# Patient Record
Sex: Male | Born: 1990 | Race: White | Hispanic: No | Marital: Single | Smoking: Never smoker
Health system: Southern US, Community
[De-identification: ages and names within clinical notes are randomized; demographics above are authoritative.]

## PROBLEM LIST (undated history)

## (undated) DIAGNOSIS — N63 Unspecified lump in unspecified breast: Secondary | ICD-10-CM

---
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
## 2014-10-30 HISTORY — PX: ANKLE ARTHROSCOPY: SHX545

## 2015-05-12 ENCOUNTER — Other Ambulatory Visit: Payer: Self-pay | Admitting: Surgery

## 2015-05-13 ENCOUNTER — Other Ambulatory Visit: Payer: Self-pay | Admitting: Surgery

## 2015-05-13 DIAGNOSIS — N63 Unspecified lump in unspecified breast: Secondary | ICD-10-CM

## 2015-05-17 ENCOUNTER — Ambulatory Visit
Admission: RE | Admit: 2015-05-17 | Discharge: 2015-05-17 | Disposition: A | Discharge status: Home / Self Care | Payer: 59 | Source: Ambulatory Visit | Attending: Surgery | Admitting: Surgery

## 2015-05-17 DIAGNOSIS — N63 Unspecified lump in unspecified breast: Secondary | ICD-10-CM

## 2015-05-27 ENCOUNTER — Other Ambulatory Visit: Payer: Self-pay | Admitting: Surgery

## 2015-06-30 DIAGNOSIS — N63 Unspecified lump in unspecified breast: Secondary | ICD-10-CM

## 2015-06-30 HISTORY — DX: Unspecified lump in unspecified breast: N63.0

## 2015-07-15 ENCOUNTER — Encounter (HOSPITAL_BASED_OUTPATIENT_CLINIC_OR_DEPARTMENT_OTHER): Payer: Self-pay | Admitting: *Deleted

## 2015-07-21 NOTE — H&P (Signed)
Brian Mcmahon  [facility] Patient #: [id number] DOB: 04/19/1990 Single / Language: [place] / Race: White Male   History of Present Illness The patient is a 25 year old male who presents with a breast mass. This gentleman is referred by Charlies SilversJennifer Couillard PA for evaluation of a left breast mass. The patient reports that he felt a small mass approximately year ago but it is slowly getting larger and causing him discomfort at his nipple and areola. He denies any drainage. He denies any trauma to the area. There is no family history of breast cancer. He is otherwise healthy without complaints.   Other Problems  No pertinent past medical history  Past Surgical History Milas Hock(Bernie Morris, CMA;  Foot Surgery Left.  Diagnostic Studies History Milas Hock(Bernie Morris, CMA;  Colonoscopy never  Allergies Milas Hock(Bernie Morris, CMA;  No Known Drug Allergies04/13/2017  Medication History Milas Hock(Bernie Morris, CMA;  No Current Medications Medications Reconciled  Social History Milas Hock(Bernie Morris, CMA; 4 Alcohol use Occasional alcohol use. No caffeine use No drug use  Family History Cyndra Numbers(Bernie Morris, CMA;  Hypertension Father.    Review of Systems Cyndra Numbers(Bernie Morris CMA; General Not Present- Appetite Loss, Chills, Fatigue, Fever, Night Sweats, Weight Gain and Weight Loss. Skin Not Present- Change in Wart/Mole, Dryness, Hives, Jaundice, New Lesions, Non-Healing Wounds, Rash and Ulcer. HEENT Not Present- Earache, Hearing Loss, Hoarseness, Nose Bleed, Oral Ulcers, Ringing in the Ears, Seasonal Allergies, Sinus Pain, Sore Throat, Visual Disturbances, Wears glasses/contact lenses and Yellow Eyes. Respiratory Not Present- Bloody sputum, Chronic Cough, Difficulty Breathing, Snoring and Wheezing. Breast Present- Breast Mass. Not Present- Breast Pain, Nipple Discharge and Skin Changes. Cardiovascular Not Present- Chest Pain, Difficulty Breathing Lying Down, Leg Cramps, Palpitations, Rapid Heart  Rate, Shortness of Breath and Swelling of Extremities. Gastrointestinal Not Present- Abdominal Pain, Bloating, Bloody Stool, Change in Bowel Habits, Chronic diarrhea, Constipation, Difficulty Swallowing, Excessive gas, Gets full quickly at meals, Hemorrhoids, Indigestion, Nausea, Rectal Pain and Vomiting. Male Genitourinary Not Present- Blood in Urine, Change in Urinary Stream, Frequency, Impotence, Nocturia, Painful Urination, Urgency and Urine Leakage. Musculoskeletal Not Present- Back Pain, Joint Pain, Joint Stiffness, Muscle Pain, Muscle Weakness and Swelling of Extremities. Neurological Not Present- Decreased Memory, Fainting, Headaches, Numbness, Seizures, Tingling, Tremor, Trouble walking and Weakness. Psychiatric Not Present- Anxiety, Bipolar, Change in Sleep Pattern, Depression, Fearful and Frequent crying. Endocrine Not Present- Cold Intolerance, Excessive Hunger, Hair Changes, Heat Intolerance, Hot flashes and New Diabetes. Hematology Not Present- Easy Bruising, Excessive bleeding, Gland problems, HIV and Persistent Infections.  Vitals  Weight: 175.2 lb Height: 75in Body Surface Area: 2.07 m Body Mass Index: 21.9 kg/m  Temp.: 98.29F(Oral)  Pulse: 62 (Regular)  BP: 120/80 (Sitting, Left Arm, Standard)    Physical Exam General Mental Status-Alert. General Appearance-Consistent with stated age. Hydration-Well hydrated. Voice-Normal.  Head and Neck Head-normocephalic, atraumatic with no lesions or palpable masses. Trachea-midline.  Eye Eyeball - Bilateral-Extraocular movements intact. Sclera/Conjunctiva - Bilateral-No scleral icterus.  Chest and Lung Exam Chest and lung exam reveals -quiet, even and easy respiratory effort with no use of accessory muscles and on auscultation, normal breath sounds, no adventitious sounds and normal vocal resonance. Inspection Chest Wall - Normal. Back - normal.  Breast Note: There is approximate 3 cm mass  underneath and lateral to the left nipple/areolar complex. It is mobile and nontender today. There is no axillary adenopathy   Cardiovascular Cardiovascular examination reveals -normal heart sounds, regular rate and rhythm with no murmurs and normal pedal pulses bilaterally.  Abdomen Inspection Skin -  Scar - no surgical scars. Hernias - Ventral - Reducible. Inguinal hernia - Left - Reducible. Palpation/Percussion Palpation and Percussion of the abdomen reveal - Soft, Non Tender, No Rebound tenderness, No Rigidity (guarding) and No hepatosplenomegaly. Auscultation Auscultation of the abdomen reveals - Bowel sounds normal.  Neurologic - Did not examine.  Musculoskeletal Normal Exam - Left-Upper Extremity Strength Normal and Lower Extremity Strength Normal. Normal Exam - Right-Upper Extremity Strength Normal, Lower Extremity Weakness.  Lymphatic Femoral & Inguinal - Did not examine. Note: There is no left axillary or supraclavicular adenopathy    Assessment & Plan  LEFT BREAST MASS   Impression: I discussed the diagnosis with the patient and his mom. I'm uncertain of the etiology of this mass. An ultrasound is recommended for further evaluation. Surgical excision will probably also be necessary. I will call back with the results of the ultrasound.  Addendum:  Ultrasound was equivocal.  Will plan on excision of the left breast mass in the OR.  I discussed the risks which include but is no limited to bleeding, infection, seroma formation, recurrence, need for further surgery, nipple necrosis, etc.  He understands and wishes to proceed.

## 2015-07-22 ENCOUNTER — Ambulatory Visit (HOSPITAL_BASED_OUTPATIENT_CLINIC_OR_DEPARTMENT_OTHER): Payer: 59 | Admitting: Anesthesiology

## 2015-07-22 ENCOUNTER — Ambulatory Visit (HOSPITAL_BASED_OUTPATIENT_CLINIC_OR_DEPARTMENT_OTHER)
Admission: RE | Admit: 2015-07-22 | Discharge: 2015-07-22 | Disposition: A | Discharge status: Home / Self Care | Payer: 59 | Source: Ambulatory Visit | Attending: Surgery | Admitting: Surgery

## 2015-07-22 ENCOUNTER — Encounter (HOSPITAL_BASED_OUTPATIENT_CLINIC_OR_DEPARTMENT_OTHER): Payer: Self-pay | Admitting: Anesthesiology

## 2015-07-22 ENCOUNTER — Encounter (HOSPITAL_BASED_OUTPATIENT_CLINIC_OR_DEPARTMENT_OTHER)
Admission: RE | Disposition: A | Discharge status: Home / Self Care | Payer: Self-pay | Source: Ambulatory Visit | Attending: Surgery

## 2015-07-22 DIAGNOSIS — N62 Hypertrophy of breast: Secondary | ICD-10-CM | POA: Diagnosis not present

## 2015-07-22 DIAGNOSIS — N63 Unspecified lump in breast: Secondary | ICD-10-CM | POA: Diagnosis present

## 2015-07-22 HISTORY — DX: Unspecified lump in unspecified breast: N63.0

## 2015-07-22 HISTORY — PX: EXCISION OF BREAST BIOPSY: SHX5822

## 2015-07-22 SURGERY — EXCISION OF BREAST BIOPSY
Anesthesia: General | Site: Breast | Laterality: Left

## 2015-07-22 MED ORDER — LACTATED RINGERS IV SOLN
INTRAVENOUS | Status: DC | PRN
  Administered 2015-07-22 (×2): via INTRAVENOUS

## 2015-07-22 MED ORDER — BUPIVACAINE-EPINEPHRINE (PF) 0.5% -1:200000 IJ SOLN
INTRAMUSCULAR | Status: AC
  Filled 2015-07-22: qty 30

## 2015-07-22 MED ORDER — PROPOFOL 10 MG/ML IV BOLUS
INTRAVENOUS | Status: DC | PRN
  Administered 2015-07-22: 200 mg via INTRAVENOUS

## 2015-07-22 MED ORDER — ACETAMINOPHEN 325 MG PO TABS: 650.0000 mg | ORAL_TABLET | ORAL | Status: DC | PRN

## 2015-07-22 MED ORDER — SODIUM CHLORIDE 0.9% FLUSH
3.0000 mL | Freq: Two times a day (BID) | INTRAVENOUS | Status: DC
Start: 2015-07-22 — End: 2015-07-22

## 2015-07-22 MED ORDER — MIDAZOLAM HCL 2 MG/2ML IJ SOLN: 0.5000 mg | Freq: Once | INTRAMUSCULAR | Status: DC | PRN

## 2015-07-22 MED ORDER — LIDOCAINE HCL (CARDIAC) 20 MG/ML IV SOLN
INTRAVENOUS | Status: DC | PRN
  Administered 2015-07-22: 30 mg via INTRAVENOUS

## 2015-07-22 MED ORDER — LIDOCAINE 2% (20 MG/ML) 5 ML SYRINGE
INTRAMUSCULAR | Status: AC
  Filled 2015-07-22: qty 5

## 2015-07-22 MED ORDER — BUPIVACAINE HCL (PF) 0.5 % IJ SOLN
INTRAMUSCULAR | Status: DC | PRN
  Administered 2015-07-22: 14 mL

## 2015-07-22 MED ORDER — MORPHINE SULFATE (PF) 2 MG/ML IV SOLN: 1.0000 mg | INTRAVENOUS | Status: DC | PRN

## 2015-07-22 MED ORDER — ONDANSETRON HCL 4 MG/2ML IJ SOLN
INTRAMUSCULAR | Status: DC | PRN
  Administered 2015-07-22: 4 mg via INTRAVENOUS

## 2015-07-22 MED ORDER — ACETAMINOPHEN 650 MG RE SUPP: 650.0000 mg | RECTAL | Status: DC | PRN

## 2015-07-22 MED ORDER — OXYCODONE-ACETAMINOPHEN 5-325 MG PO TABS: 1.0000 | ORAL_TABLET | ORAL | Status: DC | PRN

## 2015-07-22 MED ORDER — LACTATED RINGERS IV SOLN
INTRAVENOUS | Status: DC
  Administered 2015-07-22: 07:00:00 via INTRAVENOUS

## 2015-07-22 MED ORDER — GLYCOPYRROLATE 0.2 MG/ML IJ SOLN: 0.2000 mg | Freq: Once | INTRAMUSCULAR | Status: DC | PRN

## 2015-07-22 MED ORDER — SCOPOLAMINE 1 MG/3DAYS TD PT72: 1.0000 | MEDICATED_PATCH | Freq: Once | TRANSDERMAL | Status: DC | PRN

## 2015-07-22 MED ORDER — MIDAZOLAM HCL 2 MG/2ML IJ SOLN
INTRAMUSCULAR | Status: AC
  Filled 2015-07-22: qty 2

## 2015-07-22 MED ORDER — FENTANYL CITRATE (PF) 100 MCG/2ML IJ SOLN: 25.0000 ug | INTRAMUSCULAR | Status: DC | PRN

## 2015-07-22 MED ORDER — DEXAMETHASONE SODIUM PHOSPHATE 4 MG/ML IJ SOLN
INTRAMUSCULAR | Status: DC | PRN
  Administered 2015-07-22: 10 mg via INTRAVENOUS

## 2015-07-22 MED ORDER — FENTANYL CITRATE (PF) 100 MCG/2ML IJ SOLN: 50.0000 ug | INTRAMUSCULAR | Status: DC | PRN

## 2015-07-22 MED ORDER — PROMETHAZINE HCL 25 MG/ML IJ SOLN: 6.2500 mg | INTRAMUSCULAR | Status: DC | PRN

## 2015-07-22 MED ORDER — CEFAZOLIN SODIUM-DEXTROSE 2-4 GM/100ML-% IV SOLN
INTRAVENOUS | Status: AC
  Filled 2015-07-22: qty 100

## 2015-07-22 MED ORDER — KETOROLAC TROMETHAMINE 30 MG/ML IJ SOLN
INTRAMUSCULAR | Status: AC
  Filled 2015-07-22: qty 1

## 2015-07-22 MED ORDER — DEXAMETHASONE SODIUM PHOSPHATE 10 MG/ML IJ SOLN
INTRAMUSCULAR | Status: AC
  Filled 2015-07-22: qty 1

## 2015-07-22 MED ORDER — SODIUM CHLORIDE 0.9 % IV SOLN: 250.0000 mL | INTRAVENOUS | Status: DC | PRN

## 2015-07-22 MED ORDER — MIDAZOLAM HCL 5 MG/5ML IJ SOLN
INTRAMUSCULAR | Status: DC | PRN
  Administered 2015-07-22: 2 mg via INTRAVENOUS

## 2015-07-22 MED ORDER — MEPERIDINE HCL 25 MG/ML IJ SOLN: 6.2500 mg | INTRAMUSCULAR | Status: DC | PRN

## 2015-07-22 MED ORDER — PROPOFOL 10 MG/ML IV BOLUS
INTRAVENOUS | Status: AC
  Filled 2015-07-22: qty 40

## 2015-07-22 MED ORDER — ONDANSETRON HCL 4 MG/2ML IJ SOLN
INTRAMUSCULAR | Status: AC
  Filled 2015-07-22: qty 2

## 2015-07-22 MED ORDER — MIDAZOLAM HCL 2 MG/2ML IJ SOLN: 1.0000 mg | INTRAMUSCULAR | Status: DC | PRN

## 2015-07-22 MED ORDER — FENTANYL CITRATE (PF) 100 MCG/2ML IJ SOLN
INTRAMUSCULAR | Status: DC | PRN
  Administered 2015-07-22: 100 ug via INTRAVENOUS

## 2015-07-22 MED ORDER — KETOROLAC TROMETHAMINE 30 MG/ML IJ SOLN
INTRAMUSCULAR | Status: DC | PRN
  Administered 2015-07-22: 30 mg via INTRAVENOUS

## 2015-07-22 MED ORDER — BUPIVACAINE HCL (PF) 0.5 % IJ SOLN
INTRAMUSCULAR | Status: AC
  Filled 2015-07-22: qty 30

## 2015-07-22 MED ORDER — SODIUM CHLORIDE 0.9% FLUSH: 3.0000 mL | INTRAVENOUS | Status: DC | PRN

## 2015-07-22 MED ORDER — FENTANYL CITRATE (PF) 100 MCG/2ML IJ SOLN
INTRAMUSCULAR | Status: AC
  Filled 2015-07-22: qty 2

## 2015-07-22 MED ORDER — CEFAZOLIN SODIUM-DEXTROSE 2-4 GM/100ML-% IV SOLN
2.0000 g | INTRAVENOUS | Status: AC
  Administered 2015-07-22: 2 g via INTRAVENOUS

## 2015-07-22 MED ORDER — OXYCODONE HCL 5 MG PO TABS: 5.0000 mg | ORAL_TABLET | ORAL | Status: DC | PRN

## 2015-07-22 SURGICAL SUPPLY — 42 items
BLADE CLIPPER SURG (BLADE) ×2 IMPLANT
BLADE HEX COATED 2.75 (ELECTRODE) ×2 IMPLANT
BLADE SURG 15 STRL LF DISP TIS (BLADE) ×1 IMPLANT
BLADE SURG 15 STRL SS (BLADE) ×1
CANISTER SUCT 1200ML W/VALVE (MISCELLANEOUS) IMPLANT
CHLORAPREP W/TINT 26ML (MISCELLANEOUS) ×2 IMPLANT
CLIP TI WIDE RED SMALL 6 (CLIP) IMPLANT
COVER BACK TABLE 60X90IN (DRAPES) ×2 IMPLANT
COVER MAYO STAND STRL (DRAPES) ×2 IMPLANT
DECANTER SPIKE VIAL GLASS SM (MISCELLANEOUS) IMPLANT
DEVICE DUBIN W/COMP PLATE 8390 (MISCELLANEOUS) IMPLANT
DRAPE LAPAROTOMY 100X72 PEDS (DRAPES) ×2 IMPLANT
DRAPE UTILITY XL STRL (DRAPES) ×2 IMPLANT
ELECT REM PT RETURN 9FT ADLT (ELECTROSURGICAL) ×2
ELECTRODE REM PT RTRN 9FT ADLT (ELECTROSURGICAL) ×1 IMPLANT
GLOVE BIOGEL PI IND STRL 7.0 (GLOVE) ×1 IMPLANT
GLOVE BIOGEL PI INDICATOR 7.0 (GLOVE) ×1
GLOVE ECLIPSE 6.5 STRL STRAW (GLOVE) ×2 IMPLANT
GLOVE EXAM NITRILE EXT CUFF MD (GLOVE) ×2 IMPLANT
GLOVE SURG SIGNA 7.5 PF LTX (GLOVE) ×2 IMPLANT
GOWN STRL REUS W/ TWL LRG LVL3 (GOWN DISPOSABLE) ×1 IMPLANT
GOWN STRL REUS W/ TWL XL LVL3 (GOWN DISPOSABLE) ×1 IMPLANT
GOWN STRL REUS W/TWL LRG LVL3 (GOWN DISPOSABLE) ×1
GOWN STRL REUS W/TWL XL LVL3 (GOWN DISPOSABLE) ×1
KIT MARKER MARGIN INK (KITS) IMPLANT
LIQUID BAND (GAUZE/BANDAGES/DRESSINGS) ×2 IMPLANT
NEEDLE HYPO 25X1 1.5 SAFETY (NEEDLE) ×2 IMPLANT
NS IRRIG 1000ML POUR BTL (IV SOLUTION) IMPLANT
PACK BASIN DAY SURGERY FS (CUSTOM PROCEDURE TRAY) ×2 IMPLANT
PENCIL BUTTON HOLSTER BLD 10FT (ELECTRODE) ×2 IMPLANT
SLEEVE SCD COMPRESS KNEE MED (MISCELLANEOUS) ×2 IMPLANT
SPONGE GAUZE 4X4 12PLY STER LF (GAUZE/BANDAGES/DRESSINGS) IMPLANT
SPONGE LAP 4X18 X RAY DECT (DISPOSABLE) ×2 IMPLANT
SUT MNCRL AB 4-0 PS2 18 (SUTURE) ×2 IMPLANT
SUT SILK 2 0 SH (SUTURE) IMPLANT
SUT VIC AB 3-0 SH 27 (SUTURE) ×1
SUT VIC AB 3-0 SH 27X BRD (SUTURE) ×1 IMPLANT
SYR CONTROL 10ML LL (SYRINGE) ×2 IMPLANT
TOWEL OR 17X24 6PK STRL BLUE (TOWEL DISPOSABLE) ×2 IMPLANT
TOWEL OR NON WOVEN STRL DISP B (DISPOSABLE) IMPLANT
TUBE CONNECTING 20X1/4 (TUBING) IMPLANT
YANKAUER SUCT BULB TIP NO VENT (SUCTIONS) IMPLANT

## 2015-07-22 NOTE — Transfer of Care (Signed)
Immediate Anesthesia Transfer of Care Note  Patient: Brian Mcmahon  Procedure(s) Performed: Procedure(s): EXCISION LEFT BREAST  MASS (Left)  Patient Location: PACU  Anesthesia Type:General  Level of Consciousness: sedated  Airway & Oxygen Therapy: Patient Spontanous Breathing and Patient connected to face mask oxygen  Post-op Assessment: Report given to RN and Post -op Vital signs reviewed and stable  Post vital signs: Reviewed and stable  Last Vitals:  Filed Vitals:   07/22/15 0710  BP: 124/63  Pulse: 57  Temp: 36.7 C  Resp: 20    Last Pain: There were no vitals filed for this visit.       Complications: No apparent anesthesia complications

## 2015-07-22 NOTE — Interval H&P Note (Signed)
History and Physical Interval Note: no change in H and P  07/22/2015 6:58 AM  Brian Mcmahon  has presented today for surgery, with the diagnosis of Left breast mass   The various methods of treatment have been discussed with the patient and family. After consideration of risks, benefits and other options for treatment, the patient has consented to  Procedure(s): EXCISION LEFT BREAST  MASS (Left) as a surgical intervention .  The patient's history has been reviewed, patient examined, no change in status, stable for surgery.  I have reviewed the patient's chart and labs.  Questions were answered to the patient's satisfaction.     [name] A

## 2015-07-22 NOTE — Op Note (Signed)
EXCISION LEFT BREAST  MASS  Procedure Note  Marinell BlightChase B Duque 07/22/2015   Pre-op Diagnosis: Left breast mass      Post-op Diagnosis: same  Procedure(s): LEFT BREAST LUMPECTOMY  Surgeon(s): Abigail Miyamotoouglas Aniza Shor, MD  Anesthesia: General  Staff:  Circulator: Lenn CalPatricia J Campbell, RN Scrub Person: Joie BimlerFlor M Neiers, CST  Estimated Blood Loss: Minimal               Specimens: sent to path          [facility] Center[facility]ACK[facility],Emmanuella Mirante A   Date: 07/22/2015  Time: 8:15 AM

## 2015-07-22 NOTE — Op Note (Signed)
NAMLoraine Mcmahon:  Brian Mcmahon, Brian Mcmahon                  ACCOUNT NO.:  [id number]  MEDICAL RECORD NO.:  [id number]  LOCATION:                                 FACILITY:  PHYSICIAN:  Abigail Miyamotoouglas Carlena Ruybal, M.D. DATE OF BIRTH:  12-10-1990  DATE OF PROCEDURE:  07/22/2015 DATE OF DISCHARGE:                              OPERATIVE REPORT   PREOPERATIVE DIAGNOSIS:  Left breast mass.  POSTOPERATIVE DIAGNOSIS:  Left breast mass.  PROCEDURE:  Left breast lumpectomy.  SURGEON:  Abigail Miyamotoouglas Kharee Lesesne, M.D.  ANESTHESIA:  General and 0.5% Marcaine.  ESTIMATED BLOOD LOSS:  Minimal.  INDICATIONS:  This is a 25 year old gentleman who presents with a left breast mass.  Ultrasound confirmed a 4 cm mass.  There is a question whether this could represent gynecomastia.  Decision was made to proceed with excision for histologic evaluation to rule out malignancy.  PROCEDURE IN DETAIL:  The patient was brought to the operating room, identified as __________ [name].  He was placed supine on the operating room table and general anesthesia was induced.  His left breast was then prepped and draped in the usual sterile fashion.  I anesthetized the skin around the outer edge of the areola with Marcaine.  I then made a circumareolar incision with a scalpel.  I then took this down to the breast tissue with electrocautery.  I then widely excised the left breast mass with electrocautery going down to the chest wall.  Once the entire mass was removed, I had to remove another section of tissue with the cautery in the inferior portion __________ mass.  This was then sent to Pathology for evaluation.  Hemostasis was achieved with cautery.  I anesthetized the wound further with Marcaine.  I then closed subcutaneous tissue with interrupted 3-0 Vicryl sutures and closed skin with a running 4-0 Monocryl.  Skin glue was then applied.  The patient tolerated the procedure well.  All sponge, needle, and instrument counts were correct at the end of the  procedure.  The patient was then extubated in the operating room and taken in a stable condition to the recovery room.     Abigail Miyamotoouglas Ilyssa Grennan, M.D.   ______________________________ Abigail Miyamotoouglas Zaivion Kundrat, M.D.    DB/MEDQ  D:  07/22/2015  T:  07/22/2015  Job:  [id number]

## 2015-07-22 NOTE — Discharge Instructions (Signed)
[facility] °Office Phone Number [phone] ° °BREAST BIOPSY/ PARTIAL MASTECTOMY: POST OP INSTRUCTIONS ° °Always review your discharge instruction sheet given to you by the facility where your surgery was performed. ° °IF YOU HAVE DISABILITY OR FAMILY LEAVE FORMS, YOU MUST BRING THEM TO THE OFFICE FOR PROCESSING.  DO NOT GIVE THEM TO YOUR DOCTOR. ° °1. A prescription for pain medication may be given to you upon discharge.  Take your pain medication as prescribed, if needed.  If narcotic pain medicine is not needed, then you may take acetaminophen (Tylenol) or ibuprofen (Advil) as needed. °2. Take your usually prescribed medications unless otherwise directed °3. If you need a refill on your pain medication, please contact your pharmacy.  They will contact our office to request authorization.  Prescriptions will not be filled after 5pm or on week-ends. °4. You should eat very light the first 24 hours after surgery, such as soup, crackers, pudding, etc.  Resume your normal diet the day after surgery. °5. Most patients will experience some swelling and bruising in the breast.  Ice packs and a good support bra will help.  Swelling and bruising can take several days to resolve.  °6. It is common to experience some constipation if taking pain medication after surgery.  Increasing fluid intake and taking a stool softener will usually help or prevent this problem from occurring.  A mild laxative (Milk of Magnesia or Miralax) should be taken according to package directions if there are no bowel movements after 48 hours. °7. Unless discharge instructions indicate otherwise, you may remove your bandages 24-48 hours after surgery, and you may shower at that time.  You may have steri-strips (small skin tapes) in place directly over the incision.  These strips should be left on the skin for 7-10 days.  If your surgeon used skin glue on the incision, you may shower in 24 hours.  The glue will flake off over the  next 2-3 weeks.  Any sutures or staples will be removed at the office during your follow-up visit. °8. ACTIVITIES:  You may resume regular daily activities (gradually increasing) beginning the next day.  Wearing a good support bra or sports bra minimizes pain and swelling.  You may have sexual intercourse when it is comfortable. °a. You may drive when you no longer are taking prescription pain medication, you can comfortably wear a seatbelt, and you can safely maneuver your car and apply brakes. °b. RETURN TO WORK:  ______________________________________________________________________________________ °9. You should see your doctor in the office for a follow-up appointment approximately two weeks after your surgery.  Your doctor’s nurse will typically make your follow-up appointment when she calls you with your pathology report.  Expect your pathology report 2-3 business days after your surgery.  You may call to check if you do not hear from us after three days. °10. OTHER INSTRUCTIONS: _______________________________________________________________________________________________ _____________________________________________________________________________________________________________________________________ °_____________________________________________________________________________________________________________________________________ °_____________________________________________________________________________________________________________________________________ ° °WHEN TO CALL YOUR DOCTOR: °1. Fever over 101.0 °2. Nausea and/or vomiting. °3. Extreme swelling or bruising. °4. Continued bleeding from incision. °5. Increased pain, redness, or drainage from the incision. ° °The clinic staff is available to answer your questions during regular business hours.  Please don’t hesitate to call and ask to speak to one of the nurses for clinical concerns.  If you have a medical emergency, go to the nearest  emergency room or call 911.  A surgeon from [facility] is always on call at the hospital. ° °For further questions, please visit [email or web address]  ° ° ° °  Post Anesthesia Home Care Instructions ° °Activity: °Get plenty of rest for the remainder of the day. A responsible adult should stay with you for 24 hours following the procedure.  °For the next 24 hours, DO NOT: °-Drive a car °-Operate machinery °-Drink alcoholic beverages °-Take any medication unless instructed by your physician °-Make any legal decisions or sign important papers. ° °Meals: °Start with liquid foods such as gelatin or soup. Progress to regular foods as tolerated. Avoid greasy, spicy, heavy foods. If nausea and/or vomiting occur, drink only clear liquids until the nausea and/or vomiting subsides. Call your physician if vomiting continues. ° °Special Instructions/Symptoms: °Your throat may feel dry or sore from the anesthesia or the breathing tube placed in your throat during surgery. If this causes discomfort, gargle with warm salt water. The discomfort should disappear within 24 hours. ° °If you had a scopolamine patch placed behind your ear for the management of post- operative nausea and/or vomiting: ° °1. The medication in the patch is effective for 72 hours, after which it should be removed.  Wrap patch in a tissue and discard in the trash. Wash hands thoroughly with soap and water. °2. You may remove the patch earlier than 72 hours if you experience unpleasant side effects which may include dry mouth, dizziness or visual disturbances. °3. Avoid touching the patch. Wash your hands with soap and water after contact with the patch. °  ° °

## 2015-07-22 NOTE — Anesthesia Procedure Notes (Signed)
Procedure Name: LMA Insertion Date/Time: 07/22/2015 7:35 AM Performed by: Genevieve NorlanderLINKA, Destyni Hoppel L Pre-anesthesia Checklist: Patient identified, Emergency Drugs available, Suction available, Patient being monitored and Timeout performed Patient Re-evaluated:Patient Re-evaluated prior to inductionOxygen Delivery Method: Circle system utilized Preoxygenation: Pre-oxygenation with 100% oxygen Intubation Type: IV induction Ventilation: Mask ventilation without difficulty LMA: LMA inserted LMA Size: 5.0 Number of attempts: 1 Airway Equipment and Method: Bite block Placement Confirmation: positive ETCO2 Tube secured with: Tape Dental Injury: Teeth and Oropharynx as per pre-operative assessment

## 2015-07-22 NOTE — Anesthesia Preprocedure Evaluation (Signed)
Anesthesia Evaluation  Patient identified by MRN, date of birth, ID band Patient awake    Reviewed: Allergy & Precautions, NPO status , Patient's Chart, lab work & pertinent test results  History of Anesthesia Complications Negative for: history of anesthetic complications  Airway Mallampati: I  TM Distance: >3 FB Neck ROM: Full    Dental  (+) Dental Advisory Given, Teeth Intact   Pulmonary neg pulmonary ROS,    breath sounds clear to auscultation       Cardiovascular negative cardio ROS   Rhythm:Regular Rate:Normal     Neuro/Psych negative neurological ROS     GI/Hepatic negative GI ROS, Neg liver ROS,   Endo/Other  negative endocrine ROS  Renal/GU negative Renal ROS     Musculoskeletal negative musculoskeletal ROS (+)   Abdominal   Peds  Hematology negative hematology ROS (+)   Anesthesia Other Findings   Reproductive/Obstetrics                             Anesthesia Physical Anesthesia Plan  ASA: I  Anesthesia Plan: General   Post-op Pain Management:    Induction: Intravenous  Airway Management Planned: LMA  Additional Equipment:   Intra-op Plan:   Post-operative Plan:   Informed Consent: I have reviewed the patients History and Physical, chart, labs and discussed the procedure including the risks, benefits and alternatives for the proposed anesthesia with the patient or authorized representative who has indicated his/her understanding and acceptance.   Dental advisory given  Plan Discussed with: CRNA and Surgeon  Anesthesia Plan Comments: (Plan routine monitors, GA- LMA OK)        Anesthesia Quick Evaluation

## 2015-07-22 NOTE — Anesthesia Postprocedure Evaluation (Signed)
Anesthesia Post Note  Patient: Brian Mcmahon  Procedure(s) Performed: Procedure(s) (LRB): EXCISION LEFT BREAST  MASS (Left)  Patient location during evaluation: PACU Anesthesia Type: General Level of consciousness: awake and alert, oriented and patient cooperative Pain management: pain level controlled Vital Signs Assessment: post-procedure vital signs reviewed and stable Respiratory status: spontaneous breathing, nonlabored ventilation and respiratory function stable Cardiovascular status: blood pressure returned to baseline and stable Postop Assessment: no signs of nausea or vomiting Anesthetic complications: no    Last Vitals:  Filed Vitals:   07/22/15 0820 07/22/15 0830  BP: 104/68 114/75  Pulse: 54 60  Temp:    Resp:  13    Last Pain:  Filed Vitals:   07/22/15 0840  PainSc: Asleep                 Wadie Mattie,E. Eather Chaires

## 2015-07-25 ENCOUNTER — Encounter (HOSPITAL_BASED_OUTPATIENT_CLINIC_OR_DEPARTMENT_OTHER): Payer: Self-pay | Admitting: Surgery

## 2016-02-14 ENCOUNTER — Encounter (HOSPITAL_COMMUNITY): Payer: Self-pay

## 2016-02-14 ENCOUNTER — Emergency Department (HOSPITAL_COMMUNITY): Payer: 59

## 2016-02-14 ENCOUNTER — Emergency Department (HOSPITAL_COMMUNITY)
Admission: EM | Admit: 2016-02-14 | Discharge: 2016-02-14 | Disposition: A | Discharge status: Home / Self Care | Payer: 59 | Attending: Emergency Medicine | Admitting: Emergency Medicine

## 2016-02-14 DIAGNOSIS — Y939 Activity, unspecified: Secondary | ICD-10-CM | POA: Diagnosis not present

## 2016-02-14 DIAGNOSIS — M546 Pain in thoracic spine: Secondary | ICD-10-CM

## 2016-02-14 DIAGNOSIS — S299XXA Unspecified injury of thorax, initial encounter: Secondary | ICD-10-CM | POA: Insufficient documentation

## 2016-02-14 DIAGNOSIS — Y999 Unspecified external cause status: Secondary | ICD-10-CM | POA: Diagnosis not present

## 2016-02-14 DIAGNOSIS — Y92410 Unspecified street and highway as the place of occurrence of the external cause: Secondary | ICD-10-CM | POA: Diagnosis not present

## 2016-02-14 DIAGNOSIS — Z79899 Other long term (current) drug therapy: Secondary | ICD-10-CM | POA: Insufficient documentation

## 2016-02-14 MED ORDER — CYCLOBENZAPRINE HCL 10 MG PO TABS: 10.0000 mg | ORAL_TABLET | Freq: Two times a day (BID) | ORAL | 0 refills | Status: DC | PRN
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
## 2016-02-14 NOTE — ED Notes (Signed)
Patient in Xray
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
## 2016-02-14 NOTE — Discharge Instructions (Signed)
Please take Flexeril twice a day as needed for muscle spasming. Please follow up with their primary care physician in a week if symptoms worsen. Please note that you might be in more pain tomorrow. Please take ibuprofen every 6 hours as needed for pain. Warm compress to the area for 15 minutes 3-5 times a day.  Get help right away if: You develop new bowel or bladder control problems. You have unusual weakness or numbness in your arms or legs. You develop nausea or vomiting. You develop abdominal pain. You feel faint.

## 2016-02-14 NOTE — ED Notes (Signed)
Table, ambulatory, states understanding of discharge instructions
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
## 2021-02-13 ENCOUNTER — Encounter (HOSPITAL_COMMUNITY): Payer: Self-pay | Admitting: Emergency Medicine

## 2021-02-13 ENCOUNTER — Emergency Department (HOSPITAL_COMMUNITY)
Admission: EM | Admit: 2021-02-13 | Discharge: 2021-02-13 | Disposition: A | Discharge status: Home / Self Care | Payer: BC Managed Care – PPO | Attending: Emergency Medicine | Admitting: Emergency Medicine

## 2021-02-13 ENCOUNTER — Other Ambulatory Visit: Payer: Self-pay

## 2021-02-13 ENCOUNTER — Emergency Department (HOSPITAL_COMMUNITY): Payer: BC Managed Care – PPO

## 2021-02-13 DIAGNOSIS — E872 Acidosis, unspecified: Secondary | ICD-10-CM | POA: Diagnosis not present

## 2021-02-13 DIAGNOSIS — E860 Dehydration: Secondary | ICD-10-CM | POA: Diagnosis not present

## 2021-02-13 DIAGNOSIS — D72829 Elevated white blood cell count, unspecified: Secondary | ICD-10-CM | POA: Insufficient documentation

## 2021-02-13 DIAGNOSIS — Z20822 Contact with and (suspected) exposure to covid-19: Secondary | ICD-10-CM | POA: Diagnosis not present

## 2021-02-13 DIAGNOSIS — R197 Diarrhea, unspecified: Secondary | ICD-10-CM

## 2021-02-13 DIAGNOSIS — N179 Acute kidney failure, unspecified: Secondary | ICD-10-CM | POA: Diagnosis not present

## 2021-02-13 DIAGNOSIS — R112 Nausea with vomiting, unspecified: Secondary | ICD-10-CM | POA: Diagnosis present

## 2021-02-13 LAB — URINALYSIS, ROUTINE W REFLEX MICROSCOPIC
Bilirubin Urine: NEGATIVE
Glucose, UA: NEGATIVE mg/dL
Hgb urine dipstick: NEGATIVE
Ketones, ur: NEGATIVE mg/dL
Leukocytes,Ua: NEGATIVE
Nitrite: NEGATIVE
Protein, ur: 30 mg/dL — AB
Specific Gravity, Urine: 1.015 (ref 1.005–1.030)
pH: 7.5 (ref 5.0–8.0)

## 2021-02-13 LAB — CBC WITH DIFFERENTIAL/PLATELET
Abs Immature Granulocytes: 0.17 10*3/uL — ABNORMAL HIGH (ref 0.00–0.07)
Basophils Absolute: 0 10*3/uL (ref 0.0–0.1)
Basophils Relative: 0 %
Eosinophils Absolute: 0 10*3/uL (ref 0.0–0.5)
Eosinophils Relative: 0 %
HCT: 51.7 % (ref 39.0–52.0)
Hemoglobin: 17.5 g/dL — ABNORMAL HIGH (ref 13.0–17.0)
Immature Granulocytes: 1 %
Lymphocytes Relative: 2 %
Lymphs Abs: 0.3 10*3/uL — ABNORMAL LOW (ref 0.7–4.0)
MCH: 30.5 pg (ref 26.0–34.0)
MCHC: 33.8 g/dL (ref 30.0–36.0)
MCV: 90.2 fL (ref 80.0–100.0)
Monocytes Absolute: 1 10*3/uL (ref 0.1–1.0)
Monocytes Relative: 6 %
Neutro Abs: 17.1 10*3/uL — ABNORMAL HIGH (ref 1.7–7.7)
Neutrophils Relative %: 91 %
Platelets: 245 10*3/uL (ref 150–400)
RBC: 5.73 MIL/uL (ref 4.22–5.81)
RDW: 11.9 % (ref 11.5–15.5)
WBC: 18.7 10*3/uL — ABNORMAL HIGH (ref 4.0–10.5)
nRBC: 0 % (ref 0.0–0.2)

## 2021-02-13 LAB — URINALYSIS, MICROSCOPIC (REFLEX): Squamous Epithelial / LPF: NONE SEEN (ref 0–5)

## 2021-02-13 LAB — RESP PANEL BY RT-PCR (FLU A&B, COVID) ARPGX2
Influenza A by PCR: NEGATIVE
Influenza B by PCR: NEGATIVE
SARS Coronavirus 2 by RT PCR: NEGATIVE

## 2021-02-13 LAB — COMPREHENSIVE METABOLIC PANEL
ALT: 23 U/L (ref 0–44)
AST: 27 U/L (ref 15–41)
Albumin: 5.2 g/dL — ABNORMAL HIGH (ref 3.5–5.0)
Alkaline Phosphatase: 60 U/L (ref 38–126)
Anion gap: 15 (ref 5–15)
BUN: 25 mg/dL — ABNORMAL HIGH (ref 6–20)
CO2: 21 mmol/L — ABNORMAL LOW (ref 22–32)
Calcium: 9.9 mg/dL (ref 8.9–10.3)
Chloride: 102 mmol/L (ref 98–111)
Creatinine, Ser: 1.44 mg/dL — ABNORMAL HIGH (ref 0.61–1.24)
GFR, Estimated: >60 mL/min (ref >60)
Glucose, Bld: 174 mg/dL — ABNORMAL HIGH (ref 70–99)
Potassium: 5.1 mmol/L (ref 3.5–5.1)
Sodium: 138 mmol/L (ref 135–145)
Total Bilirubin: 3.9 mg/dL — ABNORMAL HIGH (ref 0.3–1.2)
Total Protein: 8.3 g/dL — ABNORMAL HIGH (ref 6.5–8.1)

## 2021-02-13 LAB — CK: Total CK: 117 U/L (ref 49–397)

## 2021-02-13 LAB — BASIC METABOLIC PANEL
Anion gap: 10 (ref 5–15)
BUN: 21 mg/dL — ABNORMAL HIGH (ref 6–20)
CO2: 22 mmol/L (ref 22–32)
Calcium: 8.4 mg/dL — ABNORMAL LOW (ref 8.9–10.3)
Chloride: 108 mmol/L (ref 98–111)
Creatinine, Ser: 1.23 mg/dL (ref 0.61–1.24)
GFR, Estimated: >60 mL/min (ref >60)
Glucose, Bld: 117 mg/dL — ABNORMAL HIGH (ref 70–99)
Potassium: 4.7 mmol/L (ref 3.5–5.1)
Sodium: 140 mmol/L (ref 135–145)

## 2021-02-13 LAB — LACTIC ACID, PLASMA
Lactic Acid, Venous: 3.6 mmol/L (ref 0.5–1.9)
Lactic Acid, Venous: 1.2 mmol/L (ref 0.5–1.9)

## 2021-02-13 LAB — CBG MONITORING, ED: Glucose-Capillary: 112 mg/dL — ABNORMAL HIGH (ref 70–99)

## 2021-02-13 LAB — LIPASE, BLOOD: Lipase: 22 U/L (ref 11–51)

## 2021-02-13 MED ORDER — ONDANSETRON HCL 4 MG PO TABS: 4.0000 mg | ORAL_TABLET | Freq: Three times a day (TID) | ORAL | 0 refills | Status: AC | PRN

## 2021-02-13 MED ORDER — LAMOTRIGINE 25 MG PO TABS: 25.0000 mg | ORAL_TABLET | Freq: Every day | ORAL | 1 refills | Status: DC

## 2021-02-13 MED ORDER — VALTOCO 5 MG DOSE 5 MG/0.1ML NA LIQD: 1.0000 | Freq: Once | NASAL | 2 refills | Status: DC

## 2021-02-13 MED ORDER — LAMOTRIGINE 25 MG PO TABS: 25.0000 mg | ORAL_TABLET | Freq: Once | ORAL | Status: DC

## 2021-02-13 MED ORDER — SODIUM CHLORIDE 0.9 % IV BOLUS
1000.0000 mL | Freq: Once | INTRAVENOUS | Status: AC
  Administered 2021-02-13: 1000 mL via INTRAVENOUS

## 2021-02-13 MED ORDER — ACETAMINOPHEN 500 MG PO TABS
1000.0000 mg | ORAL_TABLET | Freq: Once | ORAL | Status: AC
  Administered 2021-02-13: 1000 mg via ORAL
  Filled 2021-02-13: qty 2

## 2021-02-13 MED ORDER — IPRATROPIUM-ALBUTEROL 0.5-2.5 (3) MG/3ML IN SOLN: 3.0000 mL | Freq: Once | RESPIRATORY_TRACT | Status: DC

## 2021-02-13 MED ORDER — MAGNESIUM SULFATE 2 GM/50ML IV SOLN: 2.0000 g | INTRAVENOUS | Status: DC

## 2021-02-13 MED ORDER — IBUPROFEN 800 MG PO TABS
800.0000 mg | ORAL_TABLET | Freq: Once | ORAL | Status: AC
  Administered 2021-02-13: 800 mg via ORAL
  Filled 2021-02-13: qty 1

## 2021-02-13 MED ORDER — METHYLPREDNISOLONE SODIUM SUCC 125 MG IJ SOLR: 125.0000 mg | Freq: Once | INTRAMUSCULAR | Status: DC

## 2021-02-13 MED ORDER — SODIUM CHLORIDE 0.9 % IV BOLUS
500.0000 mL | Freq: Once | INTRAVENOUS | Status: AC
  Administered 2021-02-13: 500 mL via INTRAVENOUS

## 2021-02-13 NOTE — ED Triage Notes (Addendum)
Patient BIB [facility] with complaint of brown foul-smelling urine that started last night along with cramping in his entire body, primarily neck and back.  Patient reports nausea and vomiting for the last twelve hours. Patient alert and oriented at this time.  Patient received 8mg  zofran, NS, and fentanyl PTA.  EMS Vitals BP 118/70 HR 110 RR 24 CBG 176

## 2021-02-13 NOTE — ED Provider Triage Note (Signed)
Emergency Medicine Provider Triage Evaluation Note  DAMAIN VASA , a 31 y.o. male  was evaluated in triage.  Pt complains of n/v/d since 3:00 am + bodyaches and HA.No fever.  Ate Bojangles yesterday  Review of Systems  Positive: diarrhea Negative: Abd pain  Physical Exam  BP 132/86    Pulse (!) 102    Temp 98.5 F (36.9 C) (Oral)    Resp (!) 28    SpO2 98%  Gen:   Awake, no distress   Resp:  Normal effort  MSK:   Moves extremities without difficulty  Other:    Medical Decision Making  Medically screening exam initiated at 10:13 AM.  Appropriate orders placed.  Lynden Oxford was informed that the remainder of the evaluation will be completed by another provider, this initial triage assessment does not replace that evaluation, and the importance of remaining in the ED until their evaluation is complete.  Work-up initiated   Margarita Mail, PA-C 02/13/21 1014

## 2021-02-13 NOTE — Discharge Instructions (Signed)
Contact a health care provider if: °You have a fever. °Your diarrhea gets worse. °You have new symptoms. °You cannot keep fluids down. °You feel light-headed or dizzy. °You have a headache °You have muscle cramps. °Get help right away if: °You have chest pain. °You feel extremely weak or you faint. °You have bloody or black stools or stools that look like tar. °You have severe pain, cramping, or bloating in your abdomen. °You have trouble breathing or you are breathing very quickly. °Your heart is beating very quickly. °Your skin feels cold and clammy. °You feel confused. °You have signs of dehydration, such as: °Dark urine, very little urine, or no urine. °Cracked lips. °Dry mouth. °Sunken eyes. °Sleepiness. °Weakness. °

## 2021-02-13 NOTE — ED Provider Notes (Signed)
[facility] EMERGENCY DEPARTMENT Provider Note   CSN: [id number] Arrival date & time: 02/13/21  0908     History  Chief Complaint  Patient presents with   Generalized Body Aches    Brian Mcmahon is a 31 y.o. male who presents emergency department for nausea vomiting and diarrhea.  Patient states that he awoke this morning at 3 AM with onset of diarrhea followed by multiple episodes of nausea and vomiting.  He did eat [employer] last night and feels like this may be the culprit.  He has associated aching extremities and headache.  He reports that prior to onset of his nausea vomiting and diarrhea he did have dark urine this past weekend.  He denies fever or chills.  He has no contributing past medical history  HPI     Home Medications Prior to Admission medications   Medication Sig Start Date End Date Taking? Authorizing Provider  ondansetron (ZOFRAN) 4 MG tablet Take 1 tablet (4 mg total) by mouth every 8 (eight) hours as needed for nausea or vomiting. 02/13/21  Yes Arthor Captain, PA-C      Allergies    Patient has no known allergies.    Review of Systems   Review of Systems As per the HPI Physical Exam Updated Vital Signs BP 119/74 (BP Location: Right Arm)    Pulse 83    Temp 97.7 F (36.5 C) (Oral)    Resp 17    SpO2 100%  Physical Exam  ED Results / Procedures / Treatments   Labs (all labs ordered are listed, but only abnormal results are displayed) Labs Reviewed  GASTROINTESTINAL PANEL BY PCR, STOOL (REPLACES STOOL CULTURE) - Abnormal; Notable for the following components:      Result Value   Norovirus GI/GII DETECTED (*)    All other components within normal limits  COMPREHENSIVE METABOLIC PANEL - Abnormal; Notable for the following components:   CO2 21 (*)    Glucose, Bld 174 (*)    BUN 25 (*)    Creatinine, Ser 1.44 (*)    Total Protein 8.3 (*)    Albumin 5.2 (*)    Total Bilirubin 3.9 (*)    All other components within normal limits   CBC WITH DIFFERENTIAL/PLATELET - Abnormal; Notable for the following components:   WBC 18.7 (*)    Hemoglobin 17.5 (*)    Neutro Abs 17.1 (*)    Lymphs Abs 0.3 (*)    Abs Immature Granulocytes 0.17 (*)    All other components within normal limits  URINALYSIS, ROUTINE W REFLEX MICROSCOPIC - Abnormal; Notable for the following components:   Protein, ur 30 (*)    All other components within normal limits  LACTIC ACID, PLASMA - Abnormal; Notable for the following components:   Lactic Acid, Venous 3.6 (*)    All other components within normal limits  URINALYSIS, MICROSCOPIC (REFLEX) - Abnormal; Notable for the following components:   Bacteria, UA FEW (*)    All other components within normal limits  BASIC METABOLIC PANEL - Abnormal; Notable for the following components:   Glucose, Bld 117 (*)    BUN 21 (*)    Calcium 8.4 (*)    All other components within normal limits  CBG MONITORING, ED - Abnormal; Notable for the following components:   Glucose-Capillary 112 (*)    All other components within normal limits  RESP PANEL BY RT-PCR (FLU A&B, COVID) ARPGX2  CULTURE, BLOOD (ROUTINE X 2)  CULTURE, BLOOD (ROUTINE  X 2)  STOOL CULTURE  LIPASE, BLOOD  CK  MYOGLOBIN, URINE  LACTIC ACID, PLASMA    EKG None  Radiology DG Chest Port 1 View  Result Date: 02/13/2021 CLINICAL DATA:  Shortness of breath EXAM: PORTABLE CHEST 1 VIEW COMPARISON:  None. FINDINGS: The heart size and mediastinal contours are within normal limits. Both lungs are clear. The visualized skeletal structures are unremarkable. IMPRESSION: No active disease. Electronically Signed   By: Larose HiresImran  Ahmed D.O.   On: 02/13/2021 12:55    Procedures Procedures    Medications Ordered in ED Medications  acetaminophen (TYLENOL) tablet 1,000 mg (1,000 mg Oral Given 02/13/21 1159)  sodium chloride 0.9 % bolus 1,000 mL (0 mLs Intravenous Stopped 02/13/21 1349)  sodium chloride 0.9 % bolus 1,000 mL (0 mLs Intravenous Stopped 02/13/21  1529)  ibuprofen (ADVIL) tablet 800 mg (800 mg Oral Given 02/13/21 1258)  sodium chloride 0.9 % bolus 500 mL (0 mLs Intravenous Stopped 02/13/21 1722)    ED Course/ Medical Decision Making/ A&P Clinical Course as of 02/15/21 1033  Mon Feb 13, 2021  1425 Lactic Acid, Venous(!!): 3.6 Lactic acid very elevated after a total of 850 mL of fluid.  We will continue fluid resuscitation. [AH]  1425 CBC with Differential(!) CBC with elevated white blood cell count, elevated hemoglobin likely due to volume contraction [AH]  1427 CMP with AKI [AH]  1430 Respiratory panel and lipase negative along with negative CK level [AH]  1833 Comprehensive metabolic panel(!) [AH]  1834 Lactic acid, plasma Initial lactic acid significantly elevated now normalized at 1.2 [AH]  1835 Basic metabolic panel(!) Repeat BMP shows significant improvement in the patient's renal function with a normal creatinine of 1.23 [AH]    Clinical Course User Index [AH] Arthor CaptainHarris, Steffon Gladu, PA-C                           Medical Decision Making Amount and/or Complexity of Data Reviewed Labs: ordered. Decision-making details documented in ED Course. Radiology: ordered.  Risk OTC drugs. Prescription drug management.    This patient presents to the ED for concern of n/v/d, this involves an extensive number of treatment options, and is a complaint that carries with it a high risk of complications and morbidity.  The differential diagnosis includes diarrhea. The emergent differential diagnosis for vomiting includes, but is not limited to ACS/MI, DKA, Ischemic bowel, Meningitis, Sepsis, Acute gastric dilation, Adrenal insufficiency, Appendicitis,  Bowel obstruction/ileus, Carbon monoxide poisoning, Cholecystitis, Electrolyte abnormalities, Elevated ICP, Gastric outlet obstruction, Pancreatitis, Ruptured viscus, Biliary colic, Cannabinoid hyperemesis syndrome, Gastritis, Gastroenteritis, Gastroparesis,  Narcotic withdrawal, Peptic ulcer  disease, and UTI. After consideration of all data points, I considered sepsis and admission, however patient is otherwise healthy 31 y/o and became rapidly ill- I believe the patient is likely having dehydration as the main underlying factor of his lab abnormalities. _patient labs improved- no evidence of rhabdo- aki and lactic acidosis resolved.     Co morbidities that complicate the patient evaluation  none   Additional history obtained:  Additional history obtained from patient father at bedside External [facility] from outside source obtained and [facility]   Lab Tests:  I Ordered, and personally interpreted labs.  The pertinent results include:   As detailed in the ED course   Imaging Studies ordered:  I ordered imaging studies including cxr I independently visualized and interpreted imaging which showed  No acute abnormalities I agree with  the radiologist interpretation   Cardiac Monitoring:     Medicines ordered and prescription drug management:  I ordered medication including fluids, antiemetics  for massive dehydration Reevaluation of the patient after these medicines showed that the patient improved I have reviewed the patients home medicines and have made adjustments as needed   Test Considered:  CT abdomen and pelvis, considered but patient without abdominal pain or tenderness   Critical Interventions:  Fluids rehydration   Consultations Obtained:  I requested consultation with the Dr. Katrinka Blazing of [facility],  and discussed lab and imaging findings as well as pertinent plan - Considered admission,however patient refused and asked for fluid resus and reevaluation   Problem List / ED Course:  dehydration- n/v/d   Reevaluation:  After the interventions noted above, I reevaluated the patient and found that they have :resolved   Social Determinants of Health:  close OP follow up and social  support   Dispostion:  After consideration of the diagnostic results and the patients response to treatment, I feel that the patent would benefit from discharge.  Final Clinical Impression(s) / ED Diagnoses Final diagnoses:  Dehydration  Diarrhea, unspecified type  Lactic acidosis  AKI (acute kidney injury) (HCC)    Rx / DC Orders ED Discharge Orders          Ordered    ondansetron (ZOFRAN) 4 MG tablet  Every 8 hours PRN        02/13/21 1758    diazePAM (VALTOCO 5 MG DOSE) 5 MG/0.1ML LIQD   Once,   Status:  Discontinued        02/13/21 1841    lamoTRIgine (LAMICTAL) 25 MG tablet  Daily,   Status:  Discontinued        02/13/21 1841    Ambulatory referral to Neurology  Status:  Canceled       Comments: An appointment is requested in approximately: 1 week   02/13/21 1842              Arthor Captain, PA-C 02/15/21 1033    Mancel Bale, MD 02/15/21 1726

## 2021-02-13 NOTE — ED Notes (Signed)
Attempted to swab the pt for a resp panel, pt swatted my hand and knocked the swab on the floor. Told the pt that a provider put an order in to rule out if flu or Covid is causing his body aches, pt stated that he knows that is no what is causing that. RN notified.

## 2021-02-13 NOTE — ED Notes (Signed)
Pt ambulatory to waiting room. Pt verbalized understanding of discharge instructions.   

## 2021-02-13 NOTE — ED Notes (Signed)
Date and time results received: 02/13/21 2:29 PM  (use smartphrase ".now" to insert current time)  Test: Lactic acid Critical Value: 3.6  Name of Provider Notified: Tiburcio Pea, [place]  Orders Received? Or Actions Taken?: see chart

## 2021-02-14 LAB — GASTROINTESTINAL PANEL BY PCR, STOOL (REPLACES STOOL CULTURE)

## 2021-02-14 LAB — MYOGLOBIN, URINE: Myoglobin, Ur: 9 ng/mL (ref 0–13)

## 2021-02-18 LAB — CULTURE, BLOOD (ROUTINE X 2)
Culture: NO GROWTH
Culture: NO GROWTH
Special Requests: ADEQUATE
Special Requests: ADEQUATE

## 2022-04-17 ENCOUNTER — Encounter: Payer: Self-pay | Admitting: Medical Oncology

## 2023-08-02 IMAGING — DX DG CHEST 1V PORT
1 series · 1 of 1 positions shown · non-contrast
Comparison: None.

CLINICAL DATA: Shortness of breath

EXAM:
PORTABLE CHEST 1 VIEW

[chest]
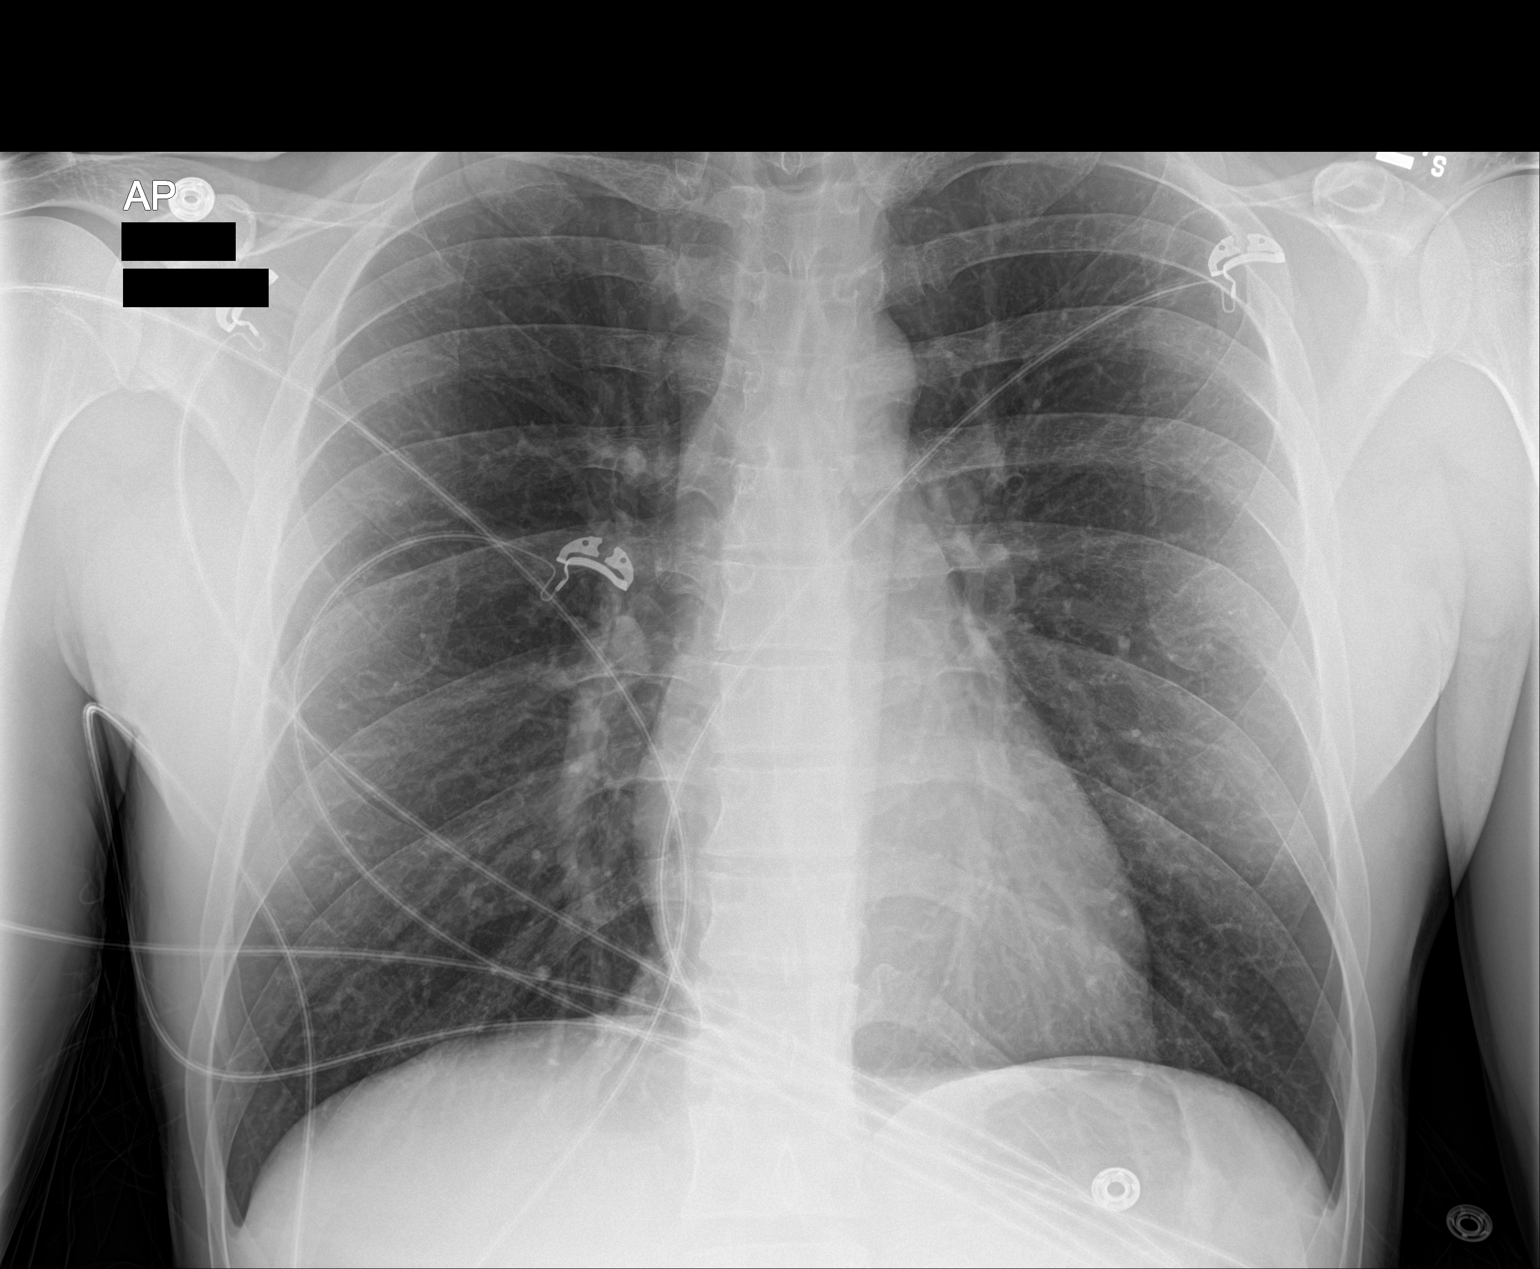

[1 of 1 positions shown; findings below may reference images not displayed]

FINDINGS: The heart size and mediastinal contours are within normal limits.
Both lungs are clear. The visualized skeletal structures are
unremarkable.
IMPRESSION: No active disease.
